# Patient Record
Sex: Female | Born: 2000 | Hispanic: Yes | Marital: Single | Smoking: Never smoker
Health system: Southern US, Community
[De-identification: ages and names within clinical notes are randomized; demographics above are authoritative.]

---

## 2021-04-17 ENCOUNTER — Other Ambulatory Visit: Payer: Self-pay

## 2021-04-17 ENCOUNTER — Encounter: Payer: Self-pay | Admitting: Emergency Medicine

## 2021-04-17 ENCOUNTER — Emergency Department: Payer: Self-pay

## 2021-04-17 DIAGNOSIS — M549 Dorsalgia, unspecified: Secondary | ICD-10-CM | POA: Insufficient documentation

## 2021-04-17 DIAGNOSIS — Y92410 Unspecified street and highway as the place of occurrence of the external cause: Secondary | ICD-10-CM | POA: Insufficient documentation

## 2021-04-17 DIAGNOSIS — Z5321 Procedure and treatment not carried out due to patient leaving prior to being seen by health care provider: Secondary | ICD-10-CM | POA: Insufficient documentation

## 2021-04-17 DIAGNOSIS — R519 Headache, unspecified: Secondary | ICD-10-CM | POA: Insufficient documentation
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
## 2021-04-18 ENCOUNTER — Emergency Department
Admission: EM | Admit: 2021-04-18 | Discharge: 2021-04-18 | Disposition: A | Discharge status: Home / Self Care | Payer: Self-pay | Attending: Emergency Medicine | Admitting: Emergency Medicine
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
